# Patient Record
Sex: Male | Born: 1965 | Hispanic: Yes | Smoking: Never smoker
Health system: Southern US, Community
[De-identification: ages and names within clinical notes are randomized; demographics above are authoritative.]

## PROBLEM LIST (undated history)

## (undated) DIAGNOSIS — I10 Essential (primary) hypertension: Secondary | ICD-10-CM

## (undated) HISTORY — PX: CHOLECYSTECTOMY: SHX55

---

## 2016-01-02 ENCOUNTER — Encounter (HOSPITAL_BASED_OUTPATIENT_CLINIC_OR_DEPARTMENT_OTHER): Payer: Self-pay | Admitting: Emergency Medicine

## 2016-01-02 ENCOUNTER — Emergency Department (HOSPITAL_BASED_OUTPATIENT_CLINIC_OR_DEPARTMENT_OTHER)
Admission: EM | Admit: 2016-01-02 | Discharge: 2016-01-02 | Disposition: A | Discharge status: Home / Self Care | Payer: Worker's Compensation | Attending: Emergency Medicine | Admitting: Emergency Medicine

## 2016-01-02 DIAGNOSIS — Y9389 Activity, other specified: Secondary | ICD-10-CM | POA: Insufficient documentation

## 2016-01-02 DIAGNOSIS — Y990 Civilian activity done for income or pay: Secondary | ICD-10-CM | POA: Diagnosis not present

## 2016-01-02 DIAGNOSIS — S39012A Strain of muscle, fascia and tendon of lower back, initial encounter: Secondary | ICD-10-CM

## 2016-01-02 DIAGNOSIS — I10 Essential (primary) hypertension: Secondary | ICD-10-CM | POA: Insufficient documentation

## 2016-01-02 DIAGNOSIS — Y929 Unspecified place or not applicable: Secondary | ICD-10-CM | POA: Insufficient documentation

## 2016-01-02 DIAGNOSIS — X500XXA Overexertion from strenuous movement or load, initial encounter: Secondary | ICD-10-CM | POA: Diagnosis not present

## 2016-01-02 DIAGNOSIS — S3992XA Unspecified injury of lower back, initial encounter: Secondary | ICD-10-CM | POA: Diagnosis present

## 2016-01-02 HISTORY — DX: Essential (primary) hypertension: I10

## 2016-01-02 MED ORDER — KETOROLAC TROMETHAMINE 60 MG/2ML IM SOLN
60.0000 mg | Freq: Once | INTRAMUSCULAR | Status: AC
  Administered 2016-01-02: 60 mg via INTRAMUSCULAR
  Filled 2016-01-02: qty 2

## 2016-01-02 MED ORDER — HYDROCODONE-ACETAMINOPHEN 5-325 MG PO TABS: 1.0000 | ORAL_TABLET | Freq: Four times a day (QID) | ORAL | Status: DC | PRN

## 2016-01-02 MED ORDER — HYDROCODONE-ACETAMINOPHEN 5-325 MG PO TABS
2.0000 | ORAL_TABLET | Freq: Once | ORAL | Status: AC
  Administered 2016-01-02: 2 via ORAL
  Filled 2016-01-02: qty 2

## 2016-01-02 NOTE — Discharge Instructions (Signed)
Ibuprofen 600 mg 3 times daily for the next 5 days.  Hydrocodone as needed for pain not relieved with ibuprofen.  Follow-up with a primary Dr. if you are not improving in 1 week to discuss physical therapy or possibly imaging studies.   Distensin lumbosacra (Lumbosacral Strain) La distensin lumbosacra es una distensin de cualquiera de las partes que componen las vrtebras lumbosacras. Las vrtebras lumbosacras son los huesos que conforman el tercio inferior de la columna vertebral. Estas vrtebras estn sostenidas por msculos y un resistente tejido fibroso (ligamentos).  CAUSAS  Un golpe repentino en la espalda puede provocar una distensin lumbosacra. Adems, cualquier tipo de movimiento que cause una elongacin excesiva de los msculos de la zona lumbar puede provocar este tipo de distensin. Esto se ve normalmente en las personas que se esfuerzan demasiado, se caen, levantan objetos pesados, se agachan o estn en cuclillas con regularidad. FACTORES DE RIESGO  Trabajo agotador.  Participar en deportes en los cuales se deba empujar o tirar y que requieren de un giro repentino de la espalda (tenis, golf, bisbol).  Levantar peso.  Curvatura excesiva de la zona lumbar.  Pelvis hacia adelante.  Espalda o msculos abdominales dbiles, o ambos.  Tendones isquiotibiales tensos. SIGNOS Y SNTOMAS  La distensin lumbosacra puede provocar dolor en la zona de la lesin o un dolor que baja (se extiende) hasta la pierna.  DIAGNSTICO Con frecuencia, el mdico puede diagnosticar una distensin BellSouthlumbosacra mediante un examen fsico. En algunos casos, es posible que deba realizarse pruebas, como una Edmondsradiografa.  TRATAMIENTO  El tratamiento para la lesin lumbar depende de muchos factores que el mdico Paediatric nursedeber evaluar. Sin embargo, la Harley-Davidsonmayora de los tratamientos incluye el uso de antiinflamatorios. INSTRUCCIONES PARA EL CUIDADO EN EL HOGAR   Evite actividades fsicas difciles (tenis,  raquetbol, esqu acutico) si no tiene un buen estado fsico para practicarlas. Esto puede Radio produceragravar o provocar problemas.  Si tiene un problema en la espalda, evite los deportes que requieren de movimientos corporales bruscos. La natacin y las caminatas son las actividades ms seguras.  Mantenga una buena postura.  Mantenga un peso saludable.  En el caso de episodios agudos, puede colocar hielo en la zona lesionada.  Ponga el hielo en una bolsa plstica.  Coloque una toalla entre la piel y la bolsa de hielo.  Deje el hielo durante 20 minutos, 2 a 3 veces por da.  Cuando la zona lumbar comience a sanar, es posible que le recomienden ejercicios de elongacin y fortalecimiento. SOLICITE ATENCIN MDICA SI:  El dolor de Oncologistespalda empeora.  Tiene un dolor de espalda intenso que no mejora con medicamentos. SOLICITE ATENCIN MDICA DE INMEDIATO SI:   Siente entumecimiento, hormigueo, debilidad o problemas con el uso de los brazos o las piernas.  Nota cambios en el control de la vejiga o el intestino.  Siente un aumento del Cytogeneticistdolor en cualquier parte del cuerpo, incluido el vientre (abdomen).  Nota que le falta el aire, se siente mareado o se desmaya.  Tiene Programme researcher, broadcasting/film/videomalestar estomacal (nuseas), vomita o comienza a sudar.  Nota un cambio de color en los dedos del pie o las piernas, o los pies se ponen muy fros. ASEGRESE DE QUE:   Comprende estas instrucciones.  Controlar su afeccin.  Recibir ayuda de inmediato si no mejora o si empeora.   Esta informacin no tiene Theme park managercomo fin reemplazar el consejo del mdico. Asegrese de hacerle al mdico cualquier pregunta que tenga.   Document Released: 05/14/2005 Document Revised: 08/09/2013 Elsevier Interactive Patient  Education ©2016 Elsevier Inc. ° °

## 2016-01-02 NOTE — ED Provider Notes (Signed)
CSN: [id number]     Arrival date & time 01/02/16  1733 History  By signing my name below, I, [facility]oscopy CenterMarrissa Washington, attest that this documentation has been prepared under the direction and in the presence of Geoffery Lyonsouglas Lyfe Reihl, MD. Electronically Signed: Randell PatientMarrissa Washington, ED Scribe. 01/02/2016. 6:41 PM.   Chief Complaint  Patient presents with  . Back Pain   Patient is a 50 y.o. male presenting with back pain. The history is provided by the patient and a relative. No language interpreter was used.  Back Pain Location:  Lumbar spine Radiates to:  Does not radiate Pain severity:  Moderate Pain is:  Same all the time Onset quality:  Gradual Duration:  1 month Timing:  Constant Progression:  Worsening Chronicity:  New Context: occupational injury and recent injury   Relieved by:  Being still Worsened by:  Movement Associated symptoms: no bladder incontinence, no bowel incontinence and no leg pain    HPI Comments: Christian Wade is a 50 y.o. male who presents to the Emergency Department complaining of constant, moderate, back pain onset 1 month ago after an injury, and worse today after heavy lifting. History provided by the relative who acted as an interpreter due to a language barrier. Relative reports that the pt injured his back 1 month ago after which he was evaluated by a doctor who wrote him out of work for 2 weeks with a lifting restriction where he was not supposed to lift anything heavier than 10 lbs when he returned. He states that pt was at work today when he lifted a heavy truck axle followed by an increase in his back pain. Pain is worse with movement and with sitting still for long periods of time. Denies recent falls, bowel/bladder incontinence, radiation of pain into his legs.  No PCP curretly Past Medical History  Diagnosis Date  . Hypertension    Past Surgical History  Procedure Laterality Date  . Appendectomy     History reviewed. No pertinent family history. Social  History  Substance Use Topics  . Smoking status: Never Smoker   . Smokeless tobacco: None  . Alcohol Use: No    Review of Systems  Gastrointestinal: Negative for bowel incontinence.  Genitourinary: Negative for bladder incontinence.  Musculoskeletal: Positive for back pain.  All other systems reviewed and are negative.     Allergies  Review of patient's allergies indicates no known allergies.  Home Medications   Prior to Admission medications   Medication Sig Start Date End Date Taking? Authorizing Provider  HYDROcodone-acetaminophen (NORCO) 5-325 MG tablet Take 1-2 tablets by mouth every 6 (six) hours as needed. 01/02/16   Geoffery Lyonsouglas Keyaira Clapham, MD   BP 133/72 mmHg  Pulse 95  Temp(Src) 97.5 F (36.4 C) (Oral)  Resp 18  Ht 5\' 6"  (1.676 m)  Wt 200 lb (90.719 kg)  BMI 32.30 kg/m2  SpO2 99% Physical Exam  Constitutional: He is oriented to person, place, and time. He appears well-developed and well-nourished.  HENT:  Head: Normocephalic and atraumatic.  Eyes: EOM are normal.  Neck: Normal range of motion.  Cardiovascular: Normal rate, regular rhythm, normal heart sounds and intact distal pulses.   Pulmonary/Chest: Effort normal and breath sounds normal. No respiratory distress.  Abdominal: Soft. He exhibits no distension. There is no tenderness.  Musculoskeletal: Normal range of motion. He exhibits tenderness.  TTP in the soft tissues of the left L-spine region.  Neurological: He is alert and oriented to person, place, and time.  DTRs are 2+ and  symmetrically in the BLE. Strength is 5/5 in BLE. Able to ambulate without difficulty.  Skin: Skin is warm and dry.  Psychiatric: He has a normal mood and affect. Judgment normal.  Nursing note and vitals reviewed.   ED Course  Procedures   DIAGNOSTIC STUDIES: Oxygen Saturation is 99% on RA, normal by my interpretation.    COORDINATION OF CARE: 6:37 PM Will order Toradol and Norco. Advised at home symptomatic treatment with rest  and ibuprofen. Will prescribe Norco. Advised for pt to follow-up with PCP as needed. Discussed treatment plan with pt at bedside and pt agreed to plan.   Labs Review Labs Reviewed - No data to display  Imaging Review No results found. I have personally reviewed and evaluated these images and lab results as part of my medical decision-making.   EKG Interpretation None      MDM   Final diagnoses:  Low back strain, initial encounter    Patient presents with complaints of low back pain that started while lifting a heavy object at work. There are no red flags in his history or exam that would suggest an emergent situation. He is having no bowel or bladder complaints or weakness and his strength and reflexes are symmetrical. He will be discharged with anti-inflammatories, pain medication, a work excuse, and when necessary follow-up with primary Dr. if not improving.  I personally performed the services described in this documentation, which was scribed in my presence. The recorded information has been reviewed and is accurate.       Geoffery Lyons, MD 01/02/16 2325

## 2016-01-02 NOTE — ED Notes (Signed)
Pt in c/o increased back pain after back injury last month. States was seen last month and given work note stating no heavy lifting, but supervisor ignored this and has him continuing to do heavy lifting. Ambulatory in NAD. Appears uncomfortable.

## 2016-07-02 NOTE — Progress Notes (Signed)
Scheduling pre op appt- please PLACE SURGICAL ORDERS IN EPIC  thanks

## 2016-07-08 ENCOUNTER — Ambulatory Visit: Payer: Self-pay | Admitting: Orthopedic Surgery

## 2016-07-15 ENCOUNTER — Ambulatory Visit: Payer: Self-pay | Admitting: Orthopedic Surgery

## 2016-07-15 NOTE — H&P (Signed)
Christian Wade is an 50 y.o. male.   Chief Complaint: back and L leg pain HPI: The patient is a 50 year old male who presents today for follow up of their back. The patient is being followed for their back pain. They are now 12 weeks and 5 days out from injury (DOI 01/02/16). Symptoms reported today include: pain. Current treatment includes: physical therapy, activity modification, pain medications and Neurontin and Voltaren Gel. The following medication has been used for pain control: Percocet. The patient reports their current pain level to be mild (to severe). The patient has not gotten any relief of their symptoms with physical therapy. Note for "Follow-up back": The patient was placed on a 10lb. lifting restriction, no bending, stooping, or squatting, and no prolonged sitting or standing. NCM Alberto Larrazabal.  Christian Wade follows up. He is having persistent lower extremity radicular pain, worse with activity, better with rest. It will radiate down into the leg. He has taken Percocet and gabapentin. He is on a light duty position. His case manager is Alberto Larrazabal.  He reports continued pain and weakness into the left leg. He is unable to fully flex.  He is having numbness down the anterior aspect of the leg as well. He has mid thoracic pain as well.  Past Medical History:  Diagnosis Date  . Hypertension     Past Surgical History:  Procedure Laterality Date  . APPENDECTOMY      No family history on file. Social History:  reports that he has never smoked. He does not have any smokeless tobacco history on file. He reports that he does not drink alcohol. His drug history is not on file.  Allergies: No Known Allergies   (Not in a hospital admission)  No results found for this or any previous visit (from the past 48 hour(s)). No results found.  Review of Systems  Constitutional: Negative.   HENT: Negative.   Eyes: Negative.   Respiratory: Negative.   Cardiovascular:  Negative.   Gastrointestinal: Negative.   Genitourinary: Negative.   Musculoskeletal: Positive for back pain.  Skin: Negative.   Neurological: Positive for sensory change and focal weakness.  Psychiatric/Behavioral: Negative.     There were no vitals taken for this visit. Physical Exam  Constitutional: He is oriented to person, place, and time. He appears well-developed. He appears distressed.  HENT:  Head: Normocephalic.  Eyes: Pupils are equal, round, and reactive to light.  Neck: Normal range of motion.  Cardiovascular: Normal rate.   Respiratory: Effort normal.  GI: Soft.  Musculoskeletal:  On exam, he is in moderate distress. Mood and affect is appropriate, leaning to the right. His femoral stretch is positive. He has 4/5 hip flexor strength as well as quad strength on the left. Decreased sensation in the L3 dermatome. Lumbar spine exam reveals no evidence of soft tissue swelling, deformity or skin ecchymosis. On palpation there is no tenderness of the lumbar spine. No flank pain with percussion. The abdomen is soft and nontender. Nontender over the trochanters. No cellulitis or lymphadenopathy.  Good range of motion of the lumbar spine without associated pain. Straight leg raise is negative. Motor is 5/5 including EHL, tibialis anterior, plantar flexion, quadriceps and hamstrings. Patient is normoreflexic. There is no Babinski or clonus. Sensory exam is intact to light touch. Patient has good distal pulses. No DVT. No pain and normal range of motion without instability of the hips, knees and ankles.  Neurological: He is alert and oriented to person,   place, and time.  Skin: Skin is warm.    Again, MRI demonstrates an extruded fragment from the L2-3 disc inferior to the L3 pedicle compressing the L3 nerve root. He has decreased knee reflex.  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  Extensive discussion concerning current pathology, relevant anatomy, and treatment options for 45 minutes dedicated in this, discussion with his translator and utilizing PowerPoint presentation and illustrated handout. We discussed living with his symptoms because he is at maximum medical improvement versus proceeding with lumbar decompression. He does have an extruded fragment and a neurologic deficit. We discussed micro lumbar decompression at L2-3 on the left. I had an extensive discussion of the risks and benefits of the lumbar decompression with the patient including bleeding, infection, damage to neurovascular structures, epidural fibrosis, CSF leak requiring repair. We also discussed increase in pain, adjacent segment disease, recurrent disc herniation, need for future surgery including repeat decompression and/or fusion. We also discussed risks of postoperative hematoma, paralysis, anesthetic complications including DVT, PE, death, cardiopulmonary dysfunction. In addition, the perioperative and postoperative courses were discussed in detail including the rehabilitative time and return to functional activity and work. I provided the patient with an illustrated handout and utilized the appropriate surgical models.  There is no history of tobacco use, negative myocardial infarction in the family or diabetes. He has hypertension. His brother actually had a stroke with hypertension at an early age. He takes a blood pressure pill. He does not currently have a dedicated family medicine physician. We will check his blood pressure and have him go for treatment for that right now to the clinic and proceed accordingly. Once he is normotensive, we can proceed with lumbar decompression. We may have to get Workmen's Comp to authorize a visit for him, but we will try that ASAP. I had an extensive discussion of the risks  and benefits of the lumbar decompression with the patient including bleeding, infection, damage to neurovascular structures, epidural fibrosis, CSF leak requiring repair. We also discussed increase in pain, adjacent segment disease, recurrent disc herniation, need for future surgery including repeat decompression and/or fusion. We also discussed risks of postoperative hematoma, paralysis, anesthetic complications including DVT, PE, death, cardiopulmonary dysfunction. In addition, the perioperative and postoperative courses were discussed in detail including the rehabilitative time and return to functional activity and work. I provided the patient with an illustrated handout and utilized the appropriate surgical models.  Overnight in the hospital, two weeks for suture removal, six weeks until physical therapy until light duty is available, three months of maximum medical improvement, six weeks of work conditioning. Continue on his Percocet, Voltaren, and Neurontin. No lifting over 10 pounds, repetitive bending, prolonged standing or sitting.  Plan microlumbar decompression L2-3 left  Nahome Bublitz, Dayna BarkerJACLYN M., PA-C for Dr. Shelle IronBeane 07/15/2016, 11:13 AM

## 2016-07-21 NOTE — Patient Instructions (Signed)
Christian Wade  07/21/2016   Your procedure is scheduled on: 07/23/2016    [facility]  Entrance take [place]  elevators to 3rd floor to  Short Stay Center at   0630 AM.  Call this number if you have problems the morning of surgery [phone]   Remember: ONLY 1 PERSON MAY GO WITH YOU TO SHORT STAY TO GET  READY MORNING OF YOUR SURGERY.  Do not eat food or drink liquids :After Midnight.     Take these medicines the morning of surgery with A SIP OF WATER: Hydrocodone if needed                                 You may not have any metal on your body including hair pins and              piercings  Do not wear jewelry,  lotions, powders or perfumes, deodorant                         Men may shave face and neck.   Do not bring valuables to the hospital. [place] IS NOT             RESPONSIBLE   FOR VALUABLES.  Contacts, dentures or bridgework may not be worn into surgery.  Leave suitcase in the car. After surgery it may be brought to your room.       Special Instructions: N/A              Please read over the following fact sheets you were given: _____________________________________________________________________             [facility] - Pre[facility] Before surgery, you can play an important role.  Because skin is not sterile, your skin needs to be as free of germs as possible.  You can reduce the number of germs on your skin by washing with CHG (chlorahexidine gluconate) soap before surgery.  CHG is an antiseptic cleaner which kills germs and bonds with the skin to continue killing germs even after washing. Please DO NOT use if you have an allergy to CHG or antibacterial soaps.  If your skin becomes reddened/irritated stop using the CHG and inform your nurse when you arrive at Short Stay. Do not shave (including legs and underarms) for at least 48 hours prior to the first CHG shower.  You may shave your face/neck. Please follow these  instructions carefully:  1.  Shower with CHG Soap the night before surgery and the  morning of Surgery.  2.  If you choose to wash your hair, wash your hair first as usual with your  normal  shampoo.  3.  After you shampoo, rinse your hair and body thoroughly to remove the  shampoo.                           4.  Use CHG as you would any other liquid soap.  You can apply chg directly  to the skin and wash                       Gently with a scrungie or clean washcloth.  5.  Apply the CHG Soap to your body ONLY FROM  THE NECK DOWN.   Do not use on face/ open                           Wound or open sores. Avoid contact with eyes, ears mouth and genitals (private parts).                       Wash face,  Genitals (private parts) with your normal soap.             6.  Wash thoroughly, paying special attention to the area where your surgery  will be performed.  7.  Thoroughly rinse your body with warm water from the neck down.  8.  DO NOT shower/wash with your normal soap after using and rinsing off  the CHG Soap.                9.  Pat yourself dry with a clean towel.            10.  Wear clean pajamas.            11.  Place clean sheets on your bed the night of your first shower and do not  sleep with pets. Day of Surgery : Do not apply any lotions/deodorants the morning of surgery.  Please wear clean clothes to the hospital/surgery center.  FAILURE TO FOLLOW THESE INSTRUCTIONS MAY RESULT IN THE CANCELLATION OF YOUR SURGERY PATIENT SIGNATURE_________________________________  NURSE SIGNATURE__________________________________  ________________________________________________________________________  WHAT IS A BLOOD TRANSFUSION? Blood Transfusion Information  A transfusion is the replacement of blood or some of its parts. Blood is made up of multiple cells which provide different functions.  Red blood cells carry oxygen and are used for blood loss replacement.  White blood cells fight against  infection.  Platelets control bleeding.  Plasma helps clot blood.  Other blood products are available for specialized needs, such as hemophilia or other clotting disorders. BEFORE THE TRANSFUSION  Who gives blood for transfusions?   Healthy volunteers who are fully evaluated to make sure their blood is safe. This is blood bank blood. Transfusion therapy is the safest it has ever been in the practice of medicine. Before blood is taken from a donor, a complete history is taken to make sure that person has no history of diseases nor engages in risky social behavior (examples are intravenous drug use or sexual activity with multiple partners). The donor's travel history is screened to minimize risk of transmitting infections, such as malaria. The donated blood is tested for signs of infectious diseases, such as HIV and hepatitis. The blood is then tested to be sure it is compatible with you in order to minimize the chance of a transfusion reaction. If you or a relative donates blood, this is often done in anticipation of surgery and is not appropriate for emergency situations. It takes many days to process the donated blood. RISKS AND COMPLICATIONS Although transfusion therapy is very safe and saves many lives, the main dangers of transfusion include:   Getting an infectious disease.  Developing a transfusion reaction. This is an allergic reaction to something in the blood you were given. Every precaution is taken to prevent this. The decision to have a blood transfusion has been considered carefully by your caregiver before blood is given. Blood is not given unless the benefits outweigh the risks. AFTER THE TRANSFUSION  Right after receiving a blood transfusion, you will usually feel much better and  more energetic. This is especially true if your red blood cells have gotten low (anemic). The transfusion raises the level of the red blood cells which carry oxygen, and this usually causes an energy  increase.  The nurse administering the transfusion will monitor you carefully for complications. HOME CARE INSTRUCTIONS  No special instructions are needed after a transfusion. You may find your energy is better. Speak with your caregiver about any limitations on activity for underlying diseases you may have. SEEK MEDICAL CARE IF:   Your condition is not improving after your transfusion.  You develop redness or irritation at the intravenous (IV) site. SEEK IMMEDIATE MEDICAL CARE IF:  Any of the following symptoms occur over the next 12 hours:  Shaking chills.  You have a temperature by mouth above 102 F (38.9 C), not controlled by medicine.  Chest, back, or muscle pain.  People around you feel you are not acting correctly or are confused.  Shortness of breath or difficulty breathing.  Dizziness and fainting.  You get a rash or develop hives.  You have a decrease in urine output.  Your urine turns a dark color or changes to pink, red, or brown. Any of the following symptoms occur over the next 10 days:  You have a temperature by mouth above 102 F (38.9 C), not controlled by medicine.  Shortness of breath.  Weakness after normal activity.  The white part of the eye turns yellow (jaundice).  You have a decrease in the amount of urine or are urinating less often.  Your urine turns a dark color or changes to pink, red, or brown. Document Released: 08/01/2000 Document Revised: 10/27/2011 Document Reviewed: 03/20/2008 ExitCare Patient Information 2014 [place].  _______________________________________________________________________  Incentive Spirometer  An incentive spirometer is a tool that can help keep your lungs clear and active. This tool measures how well you are filling your lungs with each breath. Taking long deep breaths may help reverse or decrease the chance of developing breathing (pulmonary) problems (especially infection) following:  A long  period of time when you are unable to move or be active. BEFORE THE PROCEDURE   If the spirometer includes an indicator to show your best effort, your nurse or respiratory therapist will set it to a desired goal.  If possible, sit up straight or lean slightly forward. Try not to slouch.  Hold the incentive spirometer in an upright position. INSTRUCTIONS FOR USE  1. Sit on the edge of your bed if possible, or sit up as far as you can in bed or on a chair. 2. Hold the incentive spirometer in an upright position. 3. Breathe out normally. 4. Place the mouthpiece in your mouth and seal your lips tightly around it. 5. Breathe in slowly and as deeply as possible, raising the piston or the ball toward the top of the column. 6. Hold your breath for 3-5 seconds or for as long as possible. Allow the piston or ball to fall to the bottom of the column. 7. Remove the mouthpiece from your mouth and breathe out normally. 8. Rest for a few seconds and repeat Steps 1 through 7 at least 10 times every 1-2 hours when you are awake. Take your time and take a few normal breaths between deep breaths. 9. The spirometer may include an indicator to show your best effort. Use the indicator as a goal to work toward during each repetition. 10. After each set of 10 deep breaths, practice coughing to be sure your lungs  are clear. If you have an incision (the cut made at the time of surgery), support your incision when coughing by placing a pillow or rolled up towels firmly against it. Once you are able to get out of bed, walk around indoors and cough well. You may stop using the incentive spirometer when instructed by your caregiver.  RISKS AND COMPLICATIONS  Take your time so you do not get dizzy or light-headed.  If you are in pain, you may need to take or ask for pain medication before doing incentive spirometry. It is harder to take a deep breath if you are having pain. AFTER USE  Rest and breathe slowly and  easily.  It can be helpful to keep track of a log of your progress. Your caregiver can provide you with a simple table to help with this. If you are using the spirometer at home, follow these instructions: [place] IF:   You are having difficultly using the spirometer.  You have trouble using the spirometer as often as instructed.  Your pain medication is not giving enough relief while using the spirometer.  You develop fever of 100.5 F (38.1 C) or higher. SEEK IMMEDIATE MEDICAL CARE IF:   You cough up bloody sputum that had not been present before.  You develop fever of 102 F (38.9 C) or greater.  You develop worsening pain at or near the incision site. MAKE SURE YOU:   Understand these instructions.  Will watch your condition.  Will get help right away if you are not doing well or get worse. Document Released: 12/15/2006 Document Revised: 10/27/2011 Document Reviewed: 02/15/2007 [facility] Patient Information 2014 [place], [place].   ________________________________________________________________________

## 2016-07-22 ENCOUNTER — Encounter (HOSPITAL_COMMUNITY)
Admission: RE | Admit: 2016-07-22 | Discharge: 2016-07-22 | Disposition: A | Discharge status: Home / Self Care | Payer: Worker's Compensation | Source: Ambulatory Visit | Attending: Specialist | Admitting: Specialist

## 2016-07-22 ENCOUNTER — Ambulatory Visit (HOSPITAL_COMMUNITY)
Admission: RE | Admit: 2016-07-22 | Discharge: 2016-07-22 | Disposition: A | Discharge status: Home / Self Care | Payer: Self-pay | Source: Ambulatory Visit | Attending: Orthopedic Surgery | Admitting: Orthopedic Surgery

## 2016-07-22 ENCOUNTER — Encounter (HOSPITAL_COMMUNITY): Payer: Self-pay

## 2016-07-22 DIAGNOSIS — M5126 Other intervertebral disc displacement, lumbar region: Secondary | ICD-10-CM | POA: Insufficient documentation

## 2016-07-22 DIAGNOSIS — Z79899 Other long term (current) drug therapy: Secondary | ICD-10-CM | POA: Diagnosis not present

## 2016-07-22 DIAGNOSIS — M48061 Spinal stenosis, lumbar region without neurogenic claudication: Secondary | ICD-10-CM | POA: Diagnosis not present

## 2016-07-22 DIAGNOSIS — M5136 Other intervertebral disc degeneration, lumbar region: Secondary | ICD-10-CM | POA: Insufficient documentation

## 2016-07-22 DIAGNOSIS — I10 Essential (primary) hypertension: Secondary | ICD-10-CM | POA: Diagnosis not present

## 2016-07-22 LAB — CBC
HCT: 47.1 % (ref 39.0–52.0)
Hemoglobin: 16.5 g/dL (ref 13.0–17.0)
MCH: 31 pg (ref 26.0–34.0)
MCHC: 35 g/dL (ref 30.0–36.0)
MCV: 88.4 fL (ref 78.0–100.0)
PLATELETS: 278 10*3/uL (ref 150–400)
RBC: 5.33 MIL/uL (ref 4.22–5.81)
RDW: 13.1 % (ref 11.5–15.5)
WBC: 11.5 10*3/uL — AB (ref 4.0–10.5)

## 2016-07-22 LAB — BASIC METABOLIC PANEL
Anion gap: 9 (ref 5–15)
BUN: 21 mg/dL — AB (ref 6–20)
CALCIUM: 8.8 mg/dL — AB (ref 8.9–10.3)
CO2: 25 mmol/L (ref 22–32)
Chloride: 104 mmol/L (ref 101–111)
Creatinine, Ser: 0.91 mg/dL (ref 0.61–1.24)
GFR calc Af Amer: >60 mL/min (ref >60)
Glucose, Bld: 95 mg/dL (ref 65–99)
Potassium: 3.9 mmol/L (ref 3.5–5.1)
SODIUM: 138 mmol/L (ref 135–145)

## 2016-07-22 LAB — SURGICAL PCR SCREEN
MRSA, PCR: NEGATIVE
Staphylococcus aureus: NEGATIVE

## 2016-07-22 LAB — ABO/RH: ABO/RH(D): O POS

## 2016-07-22 NOTE — Progress Notes (Signed)
CBC done 07/22/16 faxed via EPIC to DR BEane.

## 2016-07-22 NOTE — Anesthesia Preprocedure Evaluation (Addendum)
Anesthesia Evaluation  Patient identified by MRN, date of birth, ID band Patient awake    Reviewed: Allergy & Precautions, NPO status , Patient's Chart, lab work & pertinent test results  History of Anesthesia Complications Negative for: history of anesthetic complications  Airway Mallampati: II  TM Distance: >3 FB Neck ROM: Full    Dental no notable dental hx. (+) Dental Advisory Given   Pulmonary neg pulmonary ROS,    Pulmonary exam normal        Cardiovascular hypertension, Pt. on medications negative cardio ROS Normal cardiovascular exam     Neuro/Psych negative psych ROS   GI/Hepatic negative GI ROS, Neg liver ROS,   Endo/Other  negative endocrine ROS  Renal/GU negative Renal ROS     Musculoskeletal negative musculoskeletal ROS (+)   Abdominal   Peds  Hematology negative hematology ROS (+)   Anesthesia Other Findings Day of surgery medications reviewed with the patient.  Reproductive/Obstetrics                           Anesthesia Physical Anesthesia Plan  ASA: II  Anesthesia Plan: General   Post-op Pain Management:    Induction: Intravenous  Airway Management Planned: Oral ETT  Additional Equipment:   Intra-op Plan:   Post-operative Plan: Extubation in OR  Informed Consent: I have reviewed the patients History and Physical, chart, labs and discussed the procedure including the risks, benefits and alternatives for the proposed anesthesia with the patient or authorized representative who has indicated his/her understanding and acceptance.   Dental advisory given  Plan Discussed with: CRNA and Anesthesiologist  Anesthesia Plan Comments: (Interpreter was used for the interview)      Anesthesia Quick Evaluation

## 2016-07-22 NOTE — Progress Notes (Addendum)
Initially at preop visit blood pressure reading was 159/113 in right arm .  Patient states he took blood pressure medications today.  Patient denies any dizziness, lightheadedness or headache.  Patient does state he is in pain and nervous.  At end of preop appointment blood pressure was 165/98 in right arm at 1430pm.  Waited approximately 10 more minutes and at 1440pm blood pressure was 158/93 in right arm.   Patient states at preop appointment with interpreter present that was on blood pressure medications until last week that were making him feel really tired and he saw Dr Reymundo Pollarolyn Pedley and started the lisinopril and amlopine on 07/21/2016 per patient.

## 2016-07-22 NOTE — Progress Notes (Signed)
Clearance on chart dated 04/24/16 from NP.

## 2016-07-23 ENCOUNTER — Encounter (HOSPITAL_COMMUNITY): Payer: Self-pay | Admitting: *Deleted

## 2016-07-23 ENCOUNTER — Ambulatory Visit (HOSPITAL_COMMUNITY): Payer: Worker's Compensation | Admitting: Anesthesiology

## 2016-07-23 ENCOUNTER — Ambulatory Visit (HOSPITAL_COMMUNITY): Payer: Worker's Compensation

## 2016-07-23 ENCOUNTER — Ambulatory Visit (HOSPITAL_COMMUNITY)
Admission: RE | Admit: 2016-07-23 | Discharge: 2016-07-24 | Disposition: A | Discharge status: Home / Self Care | Payer: Worker's Compensation | Source: Ambulatory Visit | Attending: Specialist | Admitting: Specialist

## 2016-07-23 ENCOUNTER — Encounter (HOSPITAL_COMMUNITY)
Admission: RE | Disposition: A | Discharge status: Home / Self Care | Payer: Self-pay | Source: Ambulatory Visit | Attending: Specialist

## 2016-07-23 DIAGNOSIS — M48061 Spinal stenosis, lumbar region without neurogenic claudication: Secondary | ICD-10-CM | POA: Diagnosis present

## 2016-07-23 DIAGNOSIS — Z79899 Other long term (current) drug therapy: Secondary | ICD-10-CM | POA: Insufficient documentation

## 2016-07-23 DIAGNOSIS — I10 Essential (primary) hypertension: Secondary | ICD-10-CM | POA: Insufficient documentation

## 2016-07-23 DIAGNOSIS — M5126 Other intervertebral disc displacement, lumbar region: Secondary | ICD-10-CM | POA: Diagnosis not present

## 2016-07-23 DIAGNOSIS — R0902 Hypoxemia: Secondary | ICD-10-CM

## 2016-07-23 DIAGNOSIS — Z419 Encounter for procedure for purposes other than remedying health state, unspecified: Secondary | ICD-10-CM

## 2016-07-23 HISTORY — PX: LUMBAR LAMINECTOMY/DECOMPRESSION MICRODISCECTOMY: SHX5026

## 2016-07-23 LAB — TYPE AND SCREEN
ABO/RH(D): O POS
ANTIBODY SCREEN: NEGATIVE

## 2016-07-23 SURGERY — LUMBAR LAMINECTOMY/DECOMPRESSION MICRODISCECTOMY 1 LEVEL
Anesthesia: General | Site: Back | Laterality: Bilateral

## 2016-07-23 MED ORDER — ACETAMINOPHEN 325 MG PO TABS: 650.0000 mg | ORAL_TABLET | ORAL | Status: DC | PRN

## 2016-07-23 MED ORDER — ACETAMINOPHEN 650 MG RE SUPP: 650.0000 mg | RECTAL | Status: DC | PRN

## 2016-07-23 MED ORDER — PROPOFOL 10 MG/ML IV BOLUS
INTRAVENOUS | Status: DC | PRN
  Administered 2016-07-23: 200 mg via INTRAVENOUS

## 2016-07-23 MED ORDER — CEFAZOLIN SODIUM-DEXTROSE 2-4 GM/100ML-% IV SOLN
2.0000 g | INTRAVENOUS | Status: AC
  Administered 2016-07-23: 2 g via INTRAVENOUS

## 2016-07-23 MED ORDER — BISACODYL 5 MG PO TBEC
5.0000 mg | DELAYED_RELEASE_TABLET | Freq: Every day | ORAL | Status: DC | PRN
  Filled 2016-07-23: qty 1

## 2016-07-23 MED ORDER — LIDOCAINE-EPINEPHRINE (PF) 2 %-1:200000 IJ SOLN
INTRAMUSCULAR | Status: DC | PRN
  Administered 2016-07-23: 7 mL via INTRADERMAL

## 2016-07-23 MED ORDER — MIDAZOLAM HCL 2 MG/2ML IJ SOLN
INTRAMUSCULAR | Status: AC
  Filled 2016-07-23: qty 2

## 2016-07-23 MED ORDER — LIDOCAINE-EPINEPHRINE (PF) 2 %-1:200000 IJ SOLN
INTRAMUSCULAR | Status: AC
  Filled 2016-07-23: qty 20

## 2016-07-23 MED ORDER — DOCUSATE SODIUM 100 MG PO CAPS
100.0000 mg | ORAL_CAPSULE | Freq: Two times a day (BID) | ORAL | Status: DC
  Administered 2016-07-23 – 2016-07-24 (×2): 100 mg via ORAL
  Filled 2016-07-23 (×2): qty 1

## 2016-07-23 MED ORDER — BUPIVACAINE HCL (PF) 0.5 % IJ SOLN
INTRAMUSCULAR | Status: AC
  Filled 2016-07-23: qty 30

## 2016-07-23 MED ORDER — OXYCODONE-ACETAMINOPHEN 5-325 MG PO TABS: 1.0000 | ORAL_TABLET | ORAL | 0 refills | Status: AC | PRN

## 2016-07-23 MED ORDER — PROMETHAZINE HCL 25 MG/ML IJ SOLN: 6.2500 mg | INTRAMUSCULAR | Status: DC | PRN

## 2016-07-23 MED ORDER — FENTANYL CITRATE (PF) 100 MCG/2ML IJ SOLN
INTRAMUSCULAR | Status: AC
  Filled 2016-07-23: qty 2

## 2016-07-23 MED ORDER — SUGAMMADEX SODIUM 200 MG/2ML IV SOLN
INTRAVENOUS | Status: DC | PRN
  Administered 2016-07-23: 200 mg via INTRAVENOUS

## 2016-07-23 MED ORDER — PHENOL 1.4 % MT LIQD
1.0000 | OROMUCOSAL | Status: DC | PRN
  Filled 2016-07-23: qty 177

## 2016-07-23 MED ORDER — CEFAZOLIN SODIUM-DEXTROSE 2-4 GM/100ML-% IV SOLN
2.0000 g | Freq: Three times a day (TID) | INTRAVENOUS | Status: AC
  Administered 2016-07-23 – 2016-07-24 (×3): 2 g via INTRAVENOUS
  Filled 2016-07-23 (×3): qty 100

## 2016-07-23 MED ORDER — ONDANSETRON HCL 4 MG/2ML IJ SOLN
INTRAMUSCULAR | Status: AC
  Filled 2016-07-23: qty 2

## 2016-07-23 MED ORDER — LACTATED RINGERS IV SOLN
INTRAVENOUS | Status: DC
  Administered 2016-07-23 (×2): via INTRAVENOUS

## 2016-07-23 MED ORDER — ALUM & MAG HYDROXIDE-SIMETH 200-200-20 MG/5ML PO SUSP: 30.0000 mL | Freq: Four times a day (QID) | ORAL | Status: DC | PRN

## 2016-07-23 MED ORDER — MAGNESIUM CITRATE PO SOLN: 1.0000 | Freq: Once | ORAL | Status: DC | PRN

## 2016-07-23 MED ORDER — OXYCODONE-ACETAMINOPHEN 5-325 MG PO TABS
1.0000 | ORAL_TABLET | ORAL | Status: DC | PRN
  Administered 2016-07-24: 10:00:00 2 via ORAL
  Filled 2016-07-23 (×2): qty 2

## 2016-07-23 MED ORDER — HYDROMORPHONE HCL 1 MG/ML IJ SOLN
0.5000 mg | INTRAMUSCULAR | Status: DC | PRN
  Administered 2016-07-23: 1 mg via INTRAVENOUS
  Filled 2016-07-23: qty 1

## 2016-07-23 MED ORDER — KCL IN DEXTROSE-NACL 20-5-0.45 MEQ/L-%-% IV SOLN
INTRAVENOUS | Status: AC
  Administered 2016-07-23: 16:00:00 via INTRAVENOUS
  Filled 2016-07-23 (×2): qty 1000

## 2016-07-23 MED ORDER — DOCUSATE SODIUM 100 MG PO CAPS: 100.0000 mg | ORAL_CAPSULE | Freq: Two times a day (BID) | ORAL | 1 refills | Status: AC | PRN

## 2016-07-23 MED ORDER — RISAQUAD PO CAPS
1.0000 | ORAL_CAPSULE | Freq: Every day | ORAL | Status: DC
  Administered 2016-07-23 – 2016-07-24 (×2): 1 via ORAL
  Filled 2016-07-23 (×2): qty 1

## 2016-07-23 MED ORDER — SCOPOLAMINE 1 MG/3DAYS TD PT72
1.0000 | MEDICATED_PATCH | TRANSDERMAL | Status: DC
  Administered 2016-07-23: 1.5 mg via TRANSDERMAL
  Filled 2016-07-23: qty 1

## 2016-07-23 MED ORDER — LIDOCAINE 2% (20 MG/ML) 5 ML SYRINGE
INTRAMUSCULAR | Status: DC | PRN
  Administered 2016-07-23: 50 mg via INTRAVENOUS

## 2016-07-23 MED ORDER — METHOCARBAMOL 1000 MG/10ML IJ SOLN
500.0000 mg | Freq: Four times a day (QID) | INTRAMUSCULAR | Status: DC | PRN
  Administered 2016-07-23: 500 mg via INTRAVENOUS
  Filled 2016-07-23: qty 5
  Filled 2016-07-23: qty 550

## 2016-07-23 MED ORDER — METHOCARBAMOL 500 MG PO TABS
500.0000 mg | ORAL_TABLET | Freq: Four times a day (QID) | ORAL | Status: DC | PRN
  Administered 2016-07-24 (×2): 500 mg via ORAL
  Filled 2016-07-23 (×2): qty 1

## 2016-07-23 MED ORDER — HYDROMORPHONE HCL 1 MG/ML IJ SOLN
0.2500 mg | INTRAMUSCULAR | Status: DC | PRN
  Administered 2016-07-23: 0.25 mg via INTRAVENOUS

## 2016-07-23 MED ORDER — THROMBIN 5000 UNITS EX SOLR
CUTANEOUS | Status: AC
  Filled 2016-07-23: qty 10000

## 2016-07-23 MED ORDER — PHENYLEPHRINE 40 MCG/ML (10ML) SYRINGE FOR IV PUSH (FOR BLOOD PRESSURE SUPPORT)
PREFILLED_SYRINGE | INTRAVENOUS | Status: DC | PRN
Start: 2016-07-23 — End: 2016-07-23
  Administered 2016-07-23 (×2): 80 ug via INTRAVENOUS

## 2016-07-23 MED ORDER — FENTANYL CITRATE (PF) 100 MCG/2ML IJ SOLN
INTRAMUSCULAR | Status: DC | PRN
  Administered 2016-07-23: 50 ug via INTRAVENOUS
  Administered 2016-07-23: 100 ug via INTRAVENOUS
  Administered 2016-07-23: 50 ug via INTRAVENOUS

## 2016-07-23 MED ORDER — GABAPENTIN 300 MG PO CAPS
300.0000 mg | ORAL_CAPSULE | Freq: Three times a day (TID) | ORAL | Status: DC | PRN
  Administered 2016-07-24: 300 mg via ORAL
  Filled 2016-07-23: qty 1

## 2016-07-23 MED ORDER — LIDOCAINE HCL 1 % IJ SOLN
INTRAMUSCULAR | Status: AC
  Filled 2016-07-23: qty 20

## 2016-07-23 MED ORDER — POLYMYXIN B SULFATE 500000 UNITS IJ SOLR
INTRAMUSCULAR | Status: AC
  Filled 2016-07-23: qty 500000

## 2016-07-23 MED ORDER — ROCURONIUM BROMIDE 10 MG/ML (PF) SYRINGE
PREFILLED_SYRINGE | INTRAVENOUS | Status: DC | PRN
  Administered 2016-07-23: 40 mg via INTRAVENOUS
  Administered 2016-07-23: 10 mg via INTRAVENOUS

## 2016-07-23 MED ORDER — MIDAZOLAM HCL 5 MG/5ML IJ SOLN
INTRAMUSCULAR | Status: DC | PRN
  Administered 2016-07-23: 2 mg via INTRAVENOUS

## 2016-07-23 MED ORDER — PROPOFOL 10 MG/ML IV BOLUS
INTRAVENOUS | Status: AC
  Filled 2016-07-23: qty 40

## 2016-07-23 MED ORDER — METHOCARBAMOL 500 MG PO TABS: 500.0000 mg | ORAL_TABLET | Freq: Four times a day (QID) | ORAL | 1 refills | Status: AC | PRN

## 2016-07-23 MED ORDER — POLYETHYLENE GLYCOL 3350 17 G PO PACK: 17.0000 g | PACK | Freq: Every day | ORAL | 0 refills | Status: AC

## 2016-07-23 MED ORDER — ONDANSETRON HCL 4 MG/2ML IJ SOLN
INTRAMUSCULAR | Status: DC | PRN
  Administered 2016-07-23: 4 mg via INTRAVENOUS

## 2016-07-23 MED ORDER — HYDROCODONE-ACETAMINOPHEN 5-325 MG PO TABS
1.0000 | ORAL_TABLET | ORAL | Status: DC | PRN
  Administered 2016-07-23 – 2016-07-24 (×4): 2 via ORAL
  Filled 2016-07-23 (×4): qty 2

## 2016-07-23 MED ORDER — MENTHOL 3 MG MT LOZG
1.0000 | LOZENGE | OROMUCOSAL | Status: DC | PRN
  Filled 2016-07-23: qty 9

## 2016-07-23 MED ORDER — POLYETHYLENE GLYCOL 3350 17 G PO PACK: 17.0000 g | PACK | Freq: Every day | ORAL | Status: DC | PRN

## 2016-07-23 MED ORDER — HYDROMORPHONE HCL 1 MG/ML IJ SOLN
INTRAMUSCULAR | Status: AC
  Administered 2016-07-23: 0.25 mg via INTRAVENOUS
  Filled 2016-07-23: qty 0.5

## 2016-07-23 MED ORDER — SODIUM CHLORIDE 0.9 % IR SOLN
Status: DC | PRN
  Administered 2016-07-23: 500 mL

## 2016-07-23 MED ORDER — CEFAZOLIN SODIUM-DEXTROSE 2-4 GM/100ML-% IV SOLN
INTRAVENOUS | Status: AC
  Filled 2016-07-23: qty 100

## 2016-07-23 MED ORDER — AMLODIPINE BESYLATE 10 MG PO TABS
10.0000 mg | ORAL_TABLET | Freq: Every day | ORAL | Status: DC
  Administered 2016-07-24: 08:00:00 10 mg via ORAL
  Filled 2016-07-23: qty 1

## 2016-07-23 MED ORDER — SUCCINYLCHOLINE CHLORIDE 20 MG/ML IJ SOLN
INTRAMUSCULAR | Status: DC | PRN
  Administered 2016-07-23: 120 mg via INTRAVENOUS

## 2016-07-23 MED ORDER — ONDANSETRON HCL 4 MG/2ML IJ SOLN: 4.0000 mg | INTRAMUSCULAR | Status: DC | PRN

## 2016-07-23 MED ORDER — THROMBIN 5000 UNITS EX SOLR
OROMUCOSAL | Status: DC | PRN
  Administered 2016-07-23: 10 mL via TOPICAL

## 2016-07-23 SURGICAL SUPPLY — 50 items
BAG ZIPLOCK 12X15 (MISCELLANEOUS) ×3 IMPLANT
CLEANER TIP ELECTROSURG 2X2 (MISCELLANEOUS) ×3 IMPLANT
CLOSURE WOUND 1/2 X4 (GAUZE/BANDAGES/DRESSINGS) ×1
CLOTH 2% CHLOROHEXIDINE 3PK (PERSONAL CARE ITEMS) ×3 IMPLANT
DRAPE MICROSCOPE LEICA (MISCELLANEOUS) ×3 IMPLANT
DRAPE POUCH INSTRU U-SHP 10X18 (DRAPES) ×3 IMPLANT
DRAPE SHEET LG 3/4 BI-LAMINATE (DRAPES) ×3 IMPLANT
DRAPE SURG 17X11 SM STRL (DRAPES) ×3 IMPLANT
DRAPE UTILITY XL STRL (DRAPES) ×3 IMPLANT
DRSG AQUACEL AG ADV 3.5X 4 (GAUZE/BANDAGES/DRESSINGS) ×3 IMPLANT
DURAPREP 26ML APPLICATOR (WOUND CARE) ×3 IMPLANT
DURASEAL SPINE SEALANT 3ML (MISCELLANEOUS) IMPLANT
ELECT BLADE TIP CTD 4 INCH (ELECTRODE) IMPLANT
ELECT REM PT RETURN 9FT ADLT (ELECTROSURGICAL) ×3
ELECTRODE REM PT RTRN 9FT ADLT (ELECTROSURGICAL) ×1 IMPLANT
GLOVE BIOGEL PI IND STRL 7.0 (GLOVE) ×1 IMPLANT
GLOVE BIOGEL PI IND STRL 7.5 (GLOVE) ×2 IMPLANT
GLOVE BIOGEL PI INDICATOR 7.0 (GLOVE) ×2
GLOVE BIOGEL PI INDICATOR 7.5 (GLOVE) ×4
GLOVE SURG SS PI 7.0 STRL IVOR (GLOVE) ×3 IMPLANT
GLOVE SURG SS PI 7.5 STRL IVOR (GLOVE) ×6 IMPLANT
GLOVE SURG SS PI 8.0 STRL IVOR (GLOVE) ×6 IMPLANT
GOWN STRL REUS W/ TWL XL LVL3 (GOWN DISPOSABLE) ×1 IMPLANT
GOWN STRL REUS W/TWL XL LVL3 (GOWN DISPOSABLE) ×8 IMPLANT
IV CATH 14GX2 1/4 (CATHETERS) ×3 IMPLANT
KIT BASIN OR (CUSTOM PROCEDURE TRAY) ×3 IMPLANT
KIT POSITIONING SURG ANDREWS (MISCELLANEOUS) ×3 IMPLANT
MANIFOLD NEPTUNE II (INSTRUMENTS) ×3 IMPLANT
NEEDLE SPNL 18GX3.5 QUINCKE PK (NEEDLE) ×6 IMPLANT
PACK LAMINECTOMY ORTHO (CUSTOM PROCEDURE TRAY) ×3 IMPLANT
PATTIES SURGICAL .5 X.5 (GAUZE/BANDAGES/DRESSINGS) IMPLANT
PATTIES SURGICAL .75X.75 (GAUZE/BANDAGES/DRESSINGS) IMPLANT
PATTIES SURGICAL 1X1 (DISPOSABLE) IMPLANT
RUBBERBAND STERILE (MISCELLANEOUS) ×6 IMPLANT
SPONGE SURGIFOAM ABS GEL 100 (HEMOSTASIS) ×3 IMPLANT
STAPLER VISISTAT (STAPLE) IMPLANT
STRIP CLOSURE SKIN 1/2X4 (GAUZE/BANDAGES/DRESSINGS) ×2 IMPLANT
SUT NURALON 4 0 TR CR/8 (SUTURE) IMPLANT
SUT PROLENE 3 0 PS 2 (SUTURE) ×3 IMPLANT
SUT VIC AB 1 CT1 27 (SUTURE)
SUT VIC AB 1 CT1 27XBRD ANTBC (SUTURE) IMPLANT
SUT VIC AB 1-0 CT2 27 (SUTURE) ×3 IMPLANT
SUT VIC AB 2-0 CT1 27 (SUTURE)
SUT VIC AB 2-0 CT1 TAPERPNT 27 (SUTURE) IMPLANT
SUT VIC AB 2-0 CT2 27 (SUTURE) ×3 IMPLANT
SYR 3ML LL SCALE MARK (SYRINGE) IMPLANT
TOWEL OR 17X26 10 PK STRL BLUE (TOWEL DISPOSABLE) ×3 IMPLANT
TOWEL OR NON WOVEN STRL DISP B (DISPOSABLE) IMPLANT
TRAY FOLEY CATH 16FR SILVER (SET/KITS/TRAYS/PACK) ×3 IMPLANT
YANKAUER SUCT BULB TIP NO VENT (SUCTIONS) IMPLANT

## 2016-07-23 NOTE — Discharge Instructions (Signed)
Walk As Tolerated utilizing back precautions.  No bending, twisting, or lifting.  No driving for 2 weeks.   °Aquacel dressing may remain in place until follow up. May shower with aquacel dressing in place. If the dressing peels off or becomes saturated, you may remove aquacel dressing and place gauze and tape dressing which should be kept clean and dry and changed daily. Do not remove steri-strips if they are present. °See Dr. Kay Shippy in office in 10 to 14 days. Begin taking aspirin 81mg per day starting 4 days after your surgery if not allergic to aspirin or on another blood thinner. °Walk daily even outside. Use a cane or walker only if necessary. °Avoid sitting on soft sofas. ° °

## 2016-07-23 NOTE — Interval H&P Note (Signed)
History and Physical Interval Note:  07/23/2016 8:28 AM  Christian Wade  has presented today for surgery, with the diagnosis of HNP L2 - L3  The various methods of treatment have been discussed with the patient and family. After consideration of risks, benefits and other options for treatment, the patient has consented to  Procedure(s): MICRO LUMBAR DECOMPRESSION L2 - L3 ON THE LEFT  1 LEVEL (Left) as a surgical intervention .  The patient's history has been reviewed, patient examined, no change in status, stable for surgery.  I have reviewed the patient's chart and labs.  Questions were answered to the patient's satisfaction.     [name] C

## 2016-07-23 NOTE — Anesthesia Postprocedure Evaluation (Signed)
Anesthesia Post Note  Patient: Christian RangeEdwin Cubero Wade  Procedure(s) Performed: Procedure(s) (LRB): MICRO LUMBAR DECOMPRESSION L2 - L3 BILATERALLY, 1 LEVEL AND MICRODISCECTOMY (Bilateral)  Patient location during evaluation: PACU Anesthesia Type: General Level of consciousness: sedated Pain management: pain level controlled Vital Signs Assessment: post-procedure vital signs reviewed and stable Respiratory status: spontaneous breathing and respiratory function stable Cardiovascular status: stable Anesthetic complications: no Comments: Pt had an episode of low SaO2 prior to extubation in the OR.  Once it resolved, he remained 100%.  CXR and EKG were unchanged.     Last Vitals:  Vitals:   07/23/16 1145 07/23/16 1200  BP: (!) 139/96   Pulse: 87 87  Resp: 20 (!) 21  Temp:  36.9 C    Last Pain:  Vitals:   07/23/16 1130  TempSrc:   PainSc: 10-Worst pain ever    LLE Motor Response: Purposeful movement (07/23/16 1200) LLE Sensation: Full sensation (07/23/16 1200) RLE Motor Response: Purposeful movement (07/23/16 1200) RLE Sensation: Full sensation (07/23/16 1200)      [name] DANIEL

## 2016-07-23 NOTE — Brief Op Note (Signed)
07/23/2016  10:03 AM  PATIENT:  Christian Wade  50 y.o. male  PRE-OPERATIVE DIAGNOSIS:  HNP L2 - L3  POST-OPERATIVE DIAGNOSIS:  HNP L2 - L3  PROCEDURE:  Procedure(s): MICRO LUMBAR DECOMPRESSION L2 - L3 BILATERALLY, 1 LEVEL AND MICRODISCECTOMY (Bilateral)  SURGEON:  Surgeon(s) and Role:    * Jene EveryJeffrey Imogen Maddalena, MD - Primary  PHYSICIAN ASSISTANT:   ASSISTANTS: Biss[place]   ANESTHESIA:   general  EBL:  Total I/O In: 1000 [I.V.:1000] Out: 125 [Urine:100; Blood:25]  BLOOD ADMINISTERED:none  DRAINS: none   LOCAL MEDICATIONS USED:  XYLOCAINE   SPECIMEN:  No Specimen  DISPOSITION OF SPECIMEN:  N/A  COUNTS:  YES and NO yes  TOURNIQUET:  * No tourniquets in log *  DICTATION: .Other Dictation: Dictation Number [id number]  PLAN OF CARE: Admit for overnight observation  PATIENT DISPOSITION:  PACU - hemodynamically stable.   Delay start of Pharmacological VTE agent (>24hrs) due to surgical blood loss or risk of bleeding: yes

## 2016-07-23 NOTE — H&P (View-Only) (Signed)
Christian Wade is an 50 y.o. male.   Chief Complaint: back and L leg pain HPI: The patient is a 50 year old male who presents today for follow up of their back. The patient is being followed for their back pain. They are now 12 weeks and 5 days out from injury (DOI 01/02/16). Symptoms reported today include: pain. Current treatment includes: physical therapy, activity modification, pain medications and Neurontin and Voltaren Gel. The following medication has been used for pain control: Percocet. The patient reports their current pain level to be mild (to severe). The patient has not gotten any relief of their symptoms with physical therapy. Note for "Follow-up back": The patient was placed on a 10lb. lifting restriction, no bending, stooping, or squatting, and no prolonged sitting or standing. NCM Christian Wade.  Christian Wade follows up. He is having persistent lower extremity radicular pain, worse with activity, better with rest. It will radiate down into the leg. He has taken Percocet and gabapentin. He is on a light duty position. His case manager is Christian Wade.  He reports continued pain and weakness into the left leg. He is unable to fully flex.  He is having numbness down the anterior aspect of the leg as well. He has mid thoracic pain as well.  Past Medical History:  Diagnosis Date  . Hypertension     Past Surgical History:  Procedure Laterality Date  . APPENDECTOMY      No family history on file. Social History:  reports that he has never smoked. He does not have any smokeless tobacco history on file. He reports that he does not drink alcohol. His drug history is not on file.  Allergies: No Known Allergies   (Not in a hospital admission)  No results found for this or any previous visit (from the past 48 hour(s)). No results found.  Review of Systems  Constitutional: Negative.   HENT: Negative.   Eyes: Negative.   Respiratory: Negative.   Cardiovascular:  Negative.   Gastrointestinal: Negative.   Genitourinary: Negative.   Musculoskeletal: Positive for back pain.  Skin: Negative.   Neurological: Positive for sensory change and focal weakness.  Psychiatric/Behavioral: Negative.     There were no vitals taken for this visit. Physical Exam  Constitutional: He is oriented to person, place, and time. He appears well-developed. He appears distressed.  HENT:  Head: Normocephalic.  Eyes: Pupils are equal, round, and reactive to light.  Neck: Normal range of motion.  Cardiovascular: Normal rate.   Respiratory: Effort normal.  GI: Soft.  Musculoskeletal:  On exam, he is in moderate distress. Mood and affect is appropriate, leaning to the right. His femoral stretch is positive. He has 4/5 hip flexor strength as well as quad strength on the left. Decreased sensation in the L3 dermatome. Lumbar spine exam reveals no evidence of soft tissue swelling, deformity or skin ecchymosis. On palpation there is no tenderness of the lumbar spine. No flank pain with percussion. The abdomen is soft and nontender. Nontender over the trochanters. No cellulitis or lymphadenopathy.  Good range of motion of the lumbar spine without associated pain. Straight leg raise is negative. Motor is 5/5 including EHL, tibialis anterior, plantar flexion, quadriceps and hamstrings. Patient is normoreflexic. There is no Babinski or clonus. Sensory exam is intact to light touch. Patient has good distal pulses. No DVT. No pain and normal range of motion without instability of the hips, knees and ankles.  Neurological: He is alert and oriented to person,  place, and time.  Skin: Skin is warm.    Again, MRI demonstrates an extruded fragment from the L2-3 disc inferior to the L3 pedicle compressing the L3 nerve root. He has decreased knee reflex.  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  Extensive discussion concerning current pathology, relevant anatomy, and treatment options for 45 minutes dedicated in this, discussion with his translator and utilizing PowerPoint presentation and illustrated handout. We discussed living with his symptoms because he is at maximum medical improvement versus proceeding with lumbar decompression. He does have an extruded fragment and a neurologic deficit. We discussed micro lumbar decompression at L2-3 on the left. I had an extensive discussion of the risks and benefits of the lumbar decompression with the patient including bleeding, infection, damage to neurovascular structures, epidural fibrosis, CSF leak requiring repair. We also discussed increase in pain, adjacent segment disease, recurrent disc herniation, need for future surgery including repeat decompression and/or fusion. We also discussed risks of postoperative hematoma, paralysis, anesthetic complications including DVT, PE, death, cardiopulmonary dysfunction. In addition, the perioperative and postoperative courses were discussed in detail including the rehabilitative time and return to functional activity and work. I provided the patient with an illustrated handout and utilized the appropriate surgical models.  There is no history of tobacco use, negative myocardial infarction in the family or diabetes. He has hypertension. His brother actually had a stroke with hypertension at an early age. He takes a blood pressure pill. He does not currently have a dedicated family medicine physician. We will check his blood pressure and have him go for treatment for that right now to the clinic and proceed accordingly. Once he is normotensive, we can proceed with lumbar decompression. We may have to get Workmen's Comp to authorize a visit for him, but we will try that ASAP. I had an extensive discussion of the risks  and benefits of the lumbar decompression with the patient including bleeding, infection, damage to neurovascular structures, epidural fibrosis, CSF leak requiring repair. We also discussed increase in pain, adjacent segment disease, recurrent disc herniation, need for future surgery including repeat decompression and/or fusion. We also discussed risks of postoperative hematoma, paralysis, anesthetic complications including DVT, PE, death, cardiopulmonary dysfunction. In addition, the perioperative and postoperative courses were discussed in detail including the rehabilitative time and return to functional activity and work. I provided the patient with an illustrated handout and utilized the appropriate surgical models.  Overnight in the hospital, two weeks for suture removal, six weeks until physical therapy until light duty is available, three months of maximum medical improvement, six weeks of work conditioning. Continue on his Percocet, Voltaren, and Neurontin. No lifting over 10 pounds, repetitive bending, prolonged standing or sitting.  Plan microlumbar decompression L2-3 left  BISSELL, Dayna BarkerJACLYN M., PA-C for Dr. Shelle IronBeane 07/15/2016, 11:13 AM

## 2016-07-23 NOTE — Transfer of Care (Signed)
Immediate Anesthesia Transfer of Care Note  Patient: Christian Wade  Procedure(s) Performed: Procedure(s): MICRO LUMBAR DECOMPRESSION L2 - L3 BILATERALLY, 1 LEVEL AND MICRODISCECTOMY (Bilateral)  Patient Location: PACU  Anesthesia Type:General  Level of Consciousness: sedated, patient cooperative and responds to stimulation  Airway & Oxygen Therapy: Patient Spontanous Breathing and Patient connected to face mask oxygen  Post-op Assessment: Report given to RN and Post -op Vital signs reviewed and stable  Post vital signs: Reviewed and stable  Last Vitals:  Vitals:   07/23/16 0646  BP: (!) 156/115  Pulse: 99  Resp: 18  Temp: 36.8 C    Last Pain:  Vitals:   07/23/16 0646  TempSrc: Oral  PainSc:       Patients Stated Pain Goal: 5 (07/23/16 0629)  Complications: No apparent anesthesia complications

## 2016-07-23 NOTE — Anesthesia Procedure Notes (Signed)
Procedure Name: Intubation Performed by: Kizzie FantasiaARVER, Panzy Bubeck J Pre-anesthesia Checklist: Patient identified, Emergency Drugs available, Suction available, Patient being monitored and Timeout performed Patient Re-evaluated:Patient Re-evaluated prior to inductionOxygen Delivery Method: Circle system utilized Preoxygenation: Pre-oxygenation with 100% oxygen Intubation Type: IV induction Ventilation: Mask ventilation without difficulty Laryngoscope Size: Mac and 4 Grade View: Grade II Number of attempts: 1 Airway Equipment and Method: Stylet Secured at: 23 cm Tube secured with: Tape Dental Injury: Teeth and Oropharynx as per pre-operative assessment

## 2016-07-24 DIAGNOSIS — M5126 Other intervertebral disc displacement, lumbar region: Secondary | ICD-10-CM | POA: Diagnosis not present

## 2016-07-24 NOTE — Progress Notes (Signed)
Subjective: 1 Day Post-Op Procedure(s) (LRB): MICRO LUMBAR DECOMPRESSION L2 - L3 BILATERALLY, 1 LEVEL AND MICRODISCECTOMY (Bilateral) Patient reports pain as moderate.  Reports leg pain is improved. Noting incisional back pain. No other c/o. Seen by Dr. Shelle IronBeane in AM rounds.  Objective: Vital signs in last 24 hours: Temp:  [97.5 F (36.4 C)-99.1 F (37.3 C)] 99.1 F (37.3 C) (12/07 0516) Pulse Rate:  [81-104] 104 (12/07 0815) Resp:  [16-22] 18 (12/07 0516) BP: (124-155)/(83-104) 154/98 (12/07 0815) SpO2:  [93 %-100 %] 95 % (12/07 0516)  Intake/Output from previous day: 12/06 0701 - 12/07 0700 In: 3785 [P.O.:1380; I.V.:2150; IV Piggyback:255] Out: 2250 [Urine:2225; Blood:25] Intake/Output this shift: No intake/output data recorded.   Recent Labs  07/22/16 1443  HGB 16.5    Recent Labs  07/22/16 1443  WBC 11.5*  RBC 5.33  HCT 47.1  PLT 278    Recent Labs  07/22/16 1443  NA 138  K 3.9  CL 104  CO2 25  BUN 21*  CREATININE 0.91  GLUCOSE 95  CALCIUM 8.8*   No results for input(s): LABPT, INR in the last 72 hours.  Neurologically intact ABD soft Neurovascular intact Sensation intact distally Intact pulses distally Dorsiflexion/Plantar flexion intact Incision: dressing C/D/I and no drainage No cellulitis present Compartment soft no calf pain or sign of DVT  Assessment/Plan: 1 Day Post-Op Procedure(s) (LRB): MICRO LUMBAR DECOMPRESSION L2 - L3 BILATERALLY, 1 LEVEL AND MICRODISCECTOMY (Bilateral) Advance diet Up with therapy D/C IV fluids  Discussed D/C instructions, dressing instructions, Lspine precautions D/C later today after PT as long as pain remains well controlled Follow up 10-14 days post-op in office for suture removal  BISSELL, JACLYN M. 07/24/2016, 8:46 AM

## 2016-07-24 NOTE — Discharge Summary (Signed)
Physician Discharge Summary   Patient ID: Christian Wade MRN: [id number] DOB/AGE: [birth year] 50 y.o.  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 Hypertension    Discharge Diagnoses:   Active Problems:   Spinal stenosis of lumbar region  Procedure:  Procedure(s) (LRB): MICRO LUMBAR DECOMPRESSION L2 - L3 BILATERALLY, 1 LEVEL AND MICRODISCECTOMY (Bilateral)   Consults: None  HPI:  see H&P    Laboratory Data: Hospital Outpatient Visit on 07/22/2016  Component Date Value Ref Range Status  . Sodium 07/22/2016 138  135 - 145 mmol/L Final  . Potassium 07/22/2016 3.9  3.5 - 5.1 mmol/L Final  . Chloride 07/22/2016 104  101 - 111 mmol/L Final  . CO2 07/22/2016 25  22 - 32 mmol/L Final  . Glucose, Bld 07/22/2016 95  65 - 99 mg/dL Final  . BUN 07/22/2016 21* 6 - 20 mg/dL Final  . Creatinine, Ser 07/22/2016 0.91  0.61 - 1.24 mg/dL Final  . Calcium 07/22/2016 8.8* 8.9 - 10.3 mg/dL Final  . GFR calc non Af Amer 07/22/2016 >60  >60 mL/min Final  . GFR calc Af Amer 07/22/2016 >60  >60 mL/min Final   Comment: (NOTE) The eGFR has been calculated using the CKD EPI equation. This calculation has not been validated in all clinical situations. eGFR's persistently <60 mL/min signify possible Chronic Kidney Disease.   . Anion gap 07/22/2016 9  5 - 15 Final  . WBC 07/22/2016 11.5* 4.0 - 10.5 K/uL Final  . RBC 07/22/2016 5.33  4.22 - 5.81 MIL/uL Final  . Hemoglobin 07/22/2016 16.5  13.0 - 17.0 g/dL Final  . HCT 07/22/2016 47.1  39.0 - 52.0 % Final  . MCV 07/22/2016 88.4  78.0 - 100.0 fL Final  . MCH 07/22/2016 31.0  26.0 - 34.0 pg Final  . MCHC 07/22/2016 35.0  30.0 - 36.0 g/dL Final  . RDW 07/22/2016 13.1  11.5 - 15.5 % Final  . Platelets 07/22/2016 278  150 - 400 K/uL Final  . ABO/RH(D) 07/23/2016 O POS   Final  . Antibody Screen 07/23/2016 NEG   Final  . Sample Expiration  07/23/2016 07/26/2016   Final  . Extend sample reason 07/23/2016 NO TRANSFUSIONS OR PREGNANCY IN THE PAST 3 MONTHS   Final  . MRSA, PCR 07/22/2016 NEGATIVE  NEGATIVE Final  . Staphylococcus aureus 07/22/2016 NEGATIVE  NEGATIVE Final   Comment:        The Xpert SA Assay (FDA approved for NASAL specimens in patients over 63 years of age), is one component of a comprehensive surveillance program.  Test performance has been validated by [facility] for patients greater than or equal to 24 year old. It is not intended to diagnose infection nor to guide or monitor treatment.   . ABO/RH(D) 07/22/2016 O POS   Final    Recent Labs  07/22/16 1443  HGB 16.5    Recent Labs  07/22/16 1443  WBC 11.5*  RBC 5.33  HCT 47.1  PLT 278    Recent Labs  07/22/16 1443  NA 138  K 3.9  CL 104  CO2 25  BUN 21*  CREATININE 0.91  GLUCOSE 95  CALCIUM 8.8*   No results for input(s): LABPT, INR in the last 72 hours.  X-Rays:Dg Lumbar Spine 2-3 Views  Result Date: 07/22/2016 CLINICAL DATA:  Preop for lumbar surgery. Lower back pain for several months. EXAM:  LUMBAR SPINE - 2-3 VIEW COMPARISON:  None. FINDINGS: No fracture or spondylolisthesis is noted. Mild degenerative disc disease is noted at L2-3 and L3-4 with anterior osteophyte formation at these levels. IMPRESSION: Mild multilevel degenerative disc disease. No acute abnormality seen in the lumbar spine. Electronically Signed   By: Marijo Conception, M.D.   On: 07/22/2016 15:37   Dg Chest Port 1 View  Result Date: 07/23/2016 CLINICAL DATA:  50 year old male status post spine surgery with oxygen desaturation. Initial encounter. EXAM: PORTABLE CHEST 1 VIEW COMPARISON:  Lumbar intraoperative images today. FINDINGS: Portable AP semi upright views at 1040 hours. Low lung volumes despite a repeat image. Allowing for portable technique the lungs are clear. Normal cardiac size and mediastinal contours. Visualized tracheal air column is within normal  limits. No pneumothorax or pleural effusion identified. IMPRESSION: Low lung volumes, otherwise no acute cardiopulmonary abnormality. Electronically Signed   By: Genevie Ann M.D.   On: 07/23/2016 11:13   Dg Spine Portable 1 View  Result Date: 07/23/2016 CLINICAL DATA:  Surgery. EXAM: PORTABLE SPINE - 1 VIEW COMPARISON:  07/23/2016. FINDINGS: Lumbar vertebra numbered with the lowest segmented appearing lumbar shaped vertebral lateral view as L5. Metallic markers noted posteriorly over L3. No acute bony abnormality normal alignment mineralization. IMPRESSION: Metallic markers noted posteriorly over L3. Electronically Signed   By: Marcello Moores  Register   On: 07/23/2016 09:57   Dg Spine Portable 1 View  Result Date: 07/23/2016 CLINICAL DATA:  Intraoperative lumbar localization EXAM: PORTABLE SPINE - 1 VIEW COMPARISON:  Film from earlier in the same day FINDINGS: A surgical instrument and retractor are now seen posterior to the L2-3 interspace. The numbering nomenclature is similar to that utilized on the prior exam. IMPRESSION: Surgical instrument at the L2-3 interspace. Electronically Signed   By: Inez Catalina M.D.   On: 07/23/2016 09:24   Dg Spine Portable 1 View  Result Date: 07/23/2016 CLINICAL DATA:  L2-3 surgery. EXAM: PORTABLE SPINE - 1 VIEW COMPARISON:  07/22/2016 FINDINGS: Spinous processes are numbered in correlation with the previous plain radiographs. There are 2 visible needles, 1 directed at the L2-3 interspinous space in the other directed at the L3-4 interspinous space. IMPRESSION: L2-3 and L3-4 interspinous spaces localized. Electronically Signed   By: Nelson Chimes M.D.   On: 07/23/2016 09:10    EKG: Orders placed or performed during the hospital encounter of 07/23/16  . EKG 12-Lead  . EKG 12-Lead  . EKG 12-Lead  . EKG 12-Lead     Hospital Course: Patient was admitted to [facility] and taken to the OR and underwent the above state procedure without complications.  Patient  tolerated the procedure well and was later transferred to the recovery room and then to the orthopaedic floor for postoperative care.  They were given PO and IV analgesics for pain control following their surgery.  They were given 24 hours of postoperative antibiotics.   PT was consulted postop to assist with mobility and transfers.  The patient was allowed to be WBAT with therapy and was taught back precautions. Discharge planning was consulted to help with postop disposition and equipment needs.  Patient had a good night on the evening of surgery and started to get up OOB with therapy on day one. Patient was seen in rounds and was ready to go home on day one.  They were given discharge instructions and dressing directions.  They were instructed on when to follow up in the office with Dr. Tonita Cong.  Diet: Regular diet Activity:WBAT; Lspine precautions Follow-up:in 10-14 days Disposition - Home Discharged Condition: good   Discharge Instructions    Call MD / Call 911    Complete by:  As directed    If you experience chest pain or shortness of breath, CALL 911 and be transported to the hospital emergency room.  If you develope a fever above 101 F, pus (white drainage) or increased drainage or redness at the wound, or calf pain, call your surgeon's office.   Constipation Prevention    Complete by:  As directed    Drink plenty of fluids.  Prune juice may be helpful.  You may use a stool softener, such as Colace (over the counter) 100 mg twice a day.  Use MiraLax (over the counter) for constipation as needed.   Diet - low sodium heart healthy    Complete by:  As directed    Increase activity slowly as tolerated    Complete by:  As directed        Medication List    TAKE these medications   amLODipine 10 MG tablet Commonly known as:  NORVASC Take 10 mg by mouth daily.   docusate sodium 100 MG capsule Commonly known as:  COLACE Take 1 capsule (100 mg total) by mouth 2 (two) times daily as  needed for mild constipation.   gabapentin 300 MG capsule Commonly known as:  NEURONTIN Take 300 mg by mouth 3 (three) times daily as needed (pain).   lisinopril 10 MG tablet Commonly known as:  PRINIVIL,ZESTRIL Take 10 mg by mouth daily.   methocarbamol 500 MG tablet Commonly known as:  ROBAXIN Take 1 tablet (500 mg total) by mouth every 6 (six) hours as needed for muscle spasms.   oxyCODONE-acetaminophen 5-325 MG tablet Commonly known as:  PERCOCET Take 1-2 tablets by mouth every 4 (four) hours as needed for severe pain. What changed:  how much to take  when to take this   polyethylene glycol packet Commonly known as:  MIRALAX / GLYCOLAX Take 17 g by mouth daily.      Follow-up Information    BEANE,JEFFREY C, MD In 2 weeks.   Specialty:  Orthopedic Surgery Contact information: [address] [place] [address] [phone]           Signed: Lacie Draft, PA-C Orthopaedic Surgery 07/24/2016, 8:48 AM

## 2016-07-24 NOTE — Progress Notes (Signed)
Occupational Therapy Treatment Patient Details Name: Christian Wade MRN: [id number] DOB: 02/27/1966 Today's Date: 07/24/2016    History of present illness s/p L2-3 decompression      Follow Up Recommendations  Supervision/Assistance - 24 hour    Equipment Recommendations  3 in 1 bedside commode       Precautions / Restrictions Precautions Precautions: Back       Mobility Bed Mobility   Bed Mobility: Sit to Supine Rolling: Min assist Sidelying to sit: Min assist   Sit to supine: Min assist   General bed mobility comments: cues for back precautions  Transfers Overall transfer level: Needs assistance Equipment used: Rolling walker (2 wheeled) Transfers: Sit to/from UGI CorporationStand;Stand Pivot Transfers Sit to Stand: Min assist Stand pivot transfers: Min assist       General transfer comment: cues for technique; +2 safety.  Pt c/o dizziness:  BP 159/104        ADL Overall ADL's : Needs assistance/impaired     Grooming: Set up;Sitting;Wash/dry face       Lower Body Bathing: Maximal assistance;Sit to/from stand   Upper Body Dressing : Minimal assistance;Sitting   Lower Body Dressing: Total assistance;Sit to/from stand   Toilet Transfer: Minimal assistance;Ambulation;BSC;RW   Toileting- ArchitectClothing Manipulation and Hygiene: Min guard;Sit to/from stand         General ADL Comments: AE issued.  Back precaution education continued      Vision                     Perception     Praxis      Cognition   Behavior During Therapy: WFL for tasks assessed/performed Overall Cognitive Status: Within Functional Limits for tasks assessed                       Extremity/Trunk Assessment  Upper Extremity Assessment Upper Extremity Assessment: Defer to OT evaluation   Lower Extremity Assessment Lower Extremity Assessment: Generalized weakness        Exercises     Shoulder Instructions       General Comments      Pertinent Vitals/  Pain       Faces Pain Scale: Hurts whole lot Pain Location: back Pain Descriptors / Indicators: Sore;Aching Pain Intervention(s): Monitored during session;Relaxation;Patient requesting pain meds-RN notified  Home Living                                          Prior Functioning/Environment              Frequency  Min 2X/week        Progress Toward Goals  OT Goals(current goals can now be found in the care plan section)     Acute Rehab OT Goals Patient Stated Goal: none stated  Plan Discharge plan remains appropriate    Co-evaluation      Reason for Co-Treatment: For patient/therapist safety PT goals addressed during session: Mobility/safety with mobility OT goals addressed during session: ADL's and self-care      End of Session Equipment Utilized During Treatment: Rolling walker   Activity Tolerance Patient tolerated treatment well   Patient Left in bed;with call bell/phone within reach;with family/visitor present   Nurse Communication Mobility status    Functional Assessment Tool Used: clinical judgment and observation Functional Limitation: Self care Self Care Current Status (N0272(G8987): At least 40 percent  but less than 60 percent impaired, limited or restricted Self Care Goal Status (Z6109(G8988): At least 1 percent but less than 20 percent impaired, limited or restricted   Time: 1315-1336 OT Time Calculation (min): 21 min  Charges: OT G-codes **NOT FOR INPATIENT CLASS** Functional Assessment Tool Used: clinical judgment and observation Functional Limitation: Self care Self Care Current Status (U0454(G8987): At least 40 percent but less than 60 percent impaired, limited or restricted Self Care Goal Status (U9811(G8988): At least 1 percent but less than 20 percent impaired, limited or restricted OT General Charges $OT Visit: 1 Procedure OT Treatments $Self Care/Home Management : 8-22 mins  Christian Wade, Christian Wade 07/24/2016, 2:12 PM

## 2016-07-24 NOTE — Evaluation (Signed)
Occupational Therapy Evaluation Patient Details Name: Christian Wade Today's Date: 07/24/2016    History of Present Illness s/p L2-3 decompression   Clinical Impression   This 50 year old man was admitted for the above sx.  He will benefit from continued OT to reinforce back precaution education during adls.  Pt was limited by pain during evaluation, and needed up to total A for LB adls.  Goals are for min A.    Follow Up Recommendations  Supervision/Assistance - 24 hour    Equipment Recommendations  3 in 1 bedside comode    Recommendations for Other Services       Precautions / Restrictions Precautions Precautions: Back Restrictions Weight Bearing Restrictions: No      Mobility Bed Mobility Overal bed mobility: Needs Assistance Bed Mobility: Rolling;Sidelying to Sit Rolling: Min assist Sidelying to sit: Mod assist       General bed mobility comments: cues for back precautions  Transfers Overall transfer level: Needs assistance   Transfers: Sit to/from Stand Sit to Stand: Min assist;+2 safety/equipment         General transfer comment: cues for technique; +2 safety.  Pt c/o dizziness:  BP 159/104    Balance                                            ADL Overall ADL's : Needs assistance/impaired     Grooming: Set up;Sitting;Wash/dry face   Upper Body Bathing: Minimal assitance;Sitting   Lower Body Bathing: Maximal assistance;Sit to/from stand   Upper Body Dressing : Minimal assistance;Sitting   Lower Body Dressing: Total assistance;Sit to/from stand   Toilet Transfer: Minimal assistance;Ambulation;BSC;RW   Toileting- ArchitectClothing Manipulation and Hygiene: Min guard;Sit to/from stand         General ADL Comments: educated on back precautions. Son present. Showed AE, but pt did not practice. Demonstrated tub transfer technique, but pt is not ready.  Limited by pain and needs +2 to scoot  forward and backwards     Vision     Perception     Praxis      Pertinent Vitals/Pain Pain Assessment: Faces Faces Pain Scale: Hurts whole lot Pain Location: back Pain Descriptors / Indicators: Aching Pain Intervention(s): Limited activity within patient's tolerance;Monitored during session;Premedicated before session;Repositioned     Hand Dominance     Extremity/Trunk Assessment Upper Extremity Assessment Upper Extremity Assessment: Overall WFL for tasks assessed           Communication Communication Communication: Prefers language other than English ([place]; son interpreted)   Cognition Arousal/Alertness: Awake/alert Behavior During Therapy: WFL for tasks assessed/performed Overall Cognitive Status: Within Functional Limits for tasks assessed                     General Comments       Exercises       Shoulder Instructions      Home Living Family/patient expects to be discharged to:: Private residence Living Arrangements: Parent;Children Available Help at Discharge: Family;Available 24 hours/day   Home Access: Stairs to enter Entrance Stairs-Number of Steps: 3 Entrance Stairs-Rails: Can reach both       Bathroom Shower/Tub: Tub/shower unit Shower/tub characteristics: Engineer, building servicesCurtain Bathroom Toilet: Handicapped height     Home Equipment: None          Prior Functioning/Environment Level of Independence: Independent  OT Problem List: Decreased strength;Decreased activity tolerance;Pain;Decreased knowledge of use of DME or AE;Decreased knowledge of precautions   OT Treatment/Interventions: Self-care/ADL training;DME and/or AE instruction;Patient/family education    OT Goals(Current goals can be found in the care plan section) Acute Rehab OT Goals Patient Stated Goal: none stated OT Goal Formulation: With patient/family Time For Goal Achievement: 07/26/16 Potential to Achieve Goals: Good ADL Goals Pt Will Perform Lower  Body Bathing: with min assist;with adaptive equipment;sit to/from stand Pt Will Perform Lower Body Dressing: with min assist;with adaptive equipment;sit to/from stand Pt Will Transfer to Toilet: with min guard assist;ambulating;bedside commode Pt Will Perform Toileting - Clothing Manipulation and hygiene: with min guard assist;sit to/from stand (including sit to stand) Additional ADL Goal #1: pt will verbalize back precautions  OT Frequency: Min 2X/week   Barriers to D/C:            Co-evaluation PT/OT/SLP Co-Evaluation/Treatment: Yes Reason for Co-Treatment: For patient/therapist safety PT goals addressed during session: Mobility/safety with mobility OT goals addressed during session: ADL's and self-care      End of Session Nurse Communication: Mobility status;Patient requests pain meds  Activity Tolerance: Patient tolerated treatment well Patient left: in chair;with call bell/phone within reach;with chair alarm set;with family/visitor present   Time: 0724-0802 OT Time Calculation (min): 38 min Charges:  OT General Charges $OT Visit: 1 Procedure OT Evaluation $OT Eval Low Complexity: 1 Procedure G-Codes: OT G-codes **NOT FOR INPATIENT CLASS** Functional Assessment Tool Used: clinical judgment and observation Functional Limitation: Self care Self Care Current Status (Z6109(G8987): At least 80 percent but less than 100 percent impaired, limited or restricted Self Care Goal Status (U0454(G8988): At least 20 percent but less than 40 percent impaired, limited or restricted  Christian Wade 07/24/2016, 8:14 AM Christian Wade, OTR/L [phone] 07/24/2016

## 2016-07-24 NOTE — Evaluation (Signed)
Physical Therapy Evaluation Patient Details Name: Christian Wade MRN: [id number] DOB: 01/24/1966 Today's Date: 07/24/2016   History of Present Illness  s/p L2-3 decompression  Clinical Impression  The patient required extra time to mobilize to sitting.aMBULATION WAS TOLERATED AFTER INITIAL INCREA E IN PAIN. Pt admitted with above diagnosis. Pt currently with functional limitations due to the deficits listed below (see PT Problem List).  Pt will benefit from skilled PT to increase their independence and safety with mobility to allow discharge to the venue listed below.      Follow Up Recommendations No PT follow up    Equipment Recommendations  Rolling walker with 5" wheels    Recommendations for Other Services       Precautions / Restrictions Precautions Precautions: Back Restrictions Weight Bearing Restrictions: No      Mobility  Bed Mobility Overal bed mobility: Needs Assistance Bed Mobility: Rolling;Sidelying to Sit Rolling: Min assist Sidelying to sit: Mod assist       General bed mobility comments: cues for back precautions  Transfers Overall transfer level: Needs assistance   Transfers: Sit to/from Stand Sit to Stand: Min assist;+2 safety/equipment         General transfer comment: cues for technique; +2 safety.  Pt c/o dizziness:  BP 159/104  Ambulation/Gait Ambulation/Gait assistance: Min assist Ambulation Distance (Feet): 200 Feet Assistive device: Rolling walker (2 wheeled) Gait Pattern/deviations: Step-to pattern;Step-through pattern Gait velocity: slow   General Gait Details: gradual improvment of gait and posture.   Stairs            Wheelchair Mobility    Modified Rankin (Stroke Patients Only)       Balance                                             Pertinent Vitals/Pain Pain Assessment: Faces Faces Pain Scale: Hurts whole lot Pain Location: back Pain Descriptors / Indicators: Aching Pain  Intervention(s): Limited activity within patient's tolerance;Monitored during session;Premedicated before session;Repositioned    Home Living Family/patient expects to be discharged to:: Private residence Living Arrangements: Parent;Children Available Help at Discharge: Family;Available 24 hours/day   Home Access: Stairs to enter Entrance Stairs-Rails: Can reach both Entrance Stairs-Number of Steps: 3   Home Equipment: None      Prior Function Level of Independence: Independent               Hand Dominance        Extremity/Trunk Assessment   Upper Extremity Assessment: Defer to OT evaluation           Lower Extremity Assessment: Generalized weakness         Communication   Communication:  (Spanish, son interprets)  Cognition Arousal/Alertness: Awake/alert Behavior During Therapy: WFL for tasks assessed/performed Overall Cognitive Status: Within Functional Limits for tasks assessed                      General Comments      Exercises     Assessment/Plan    PT Assessment Patient needs continued PT services  PT Problem List Decreased activity tolerance;Decreased mobility;Decreased knowledge of precautions;Pain          PT Treatment Interventions DME instruction;Gait training;Stair training;Functional mobility training;Therapeutic activities;Patient/family education    PT Goals (Current goals can be found in the Care Plan section)  Acute Rehab PT Goals Patient Stated  Goal: none stated PT Goal Formulation: With patient/family Time For Goal Achievement: 07/26/16 Potential to Achieve Goals: Good    Frequency Min 5X/week   Barriers to discharge        Co-evaluation PT/OT/SLP Co-Evaluation/Treatment: Yes Reason for Co-Treatment: For patient/therapist safety PT goals addressed during session: Mobility/safety with mobility OT goals addressed during session: ADL's and self-care       End of Session   Activity Tolerance: Patient  tolerated treatment well Patient left: in chair;with call bell/phone within reach;with family/visitor present Nurse Communication: Mobility status    Functional Assessment Tool Used: clinical judgement Functional Limitation: Mobility: Walking and moving around Mobility: Walking and Moving Around Current Status ([id number](G8978): At least 20 percent but less than 40 percent impaired, limited or restricted Mobility: Walking and Moving Around Goal Status [phone]([id number]): At least 1 percent but less than 20 percent impaired, limited or restricted    Time: 0730-0805 PT Time Calculation (min) (ACUTE ONLY): 35 min   Charges:   PT Evaluation $PT Eval Low Complexity: 1 Procedure     PT G Codes:   PT G-Codes **NOT FOR INPATIENT CLASS** Functional Assessment Tool Used: clinical judgement Functional Limitation: Mobility: Walking and moving around Mobility: Walking and Moving Around Current Status ([id number](G8978): At least 20 percent but less than 40 percent impaired, limited or restricted Mobility: Walking and Moving Around Goal Status [phone](G8979): At least 1 percent but less than 20 percent impaired, limited or restricted    Rada HayHill, Marguerite Barba Elizabeth 07/24/2016, 11:42 AM Blanchard KelchKaren Michial Disney PT [phone]

## 2016-07-24 NOTE — Care Management Note (Signed)
Case Management Note  Patient Details  Name: Christian Wade MRN: [id number] Date of Birth: 15-Feb-1966  Subjective/Objective:                   s/p L2-3 decompression    Action/Plan: Discharge planning Expected Discharge Date:                  Expected Discharge Plan:  Home/Self Care  In-House Referral:     Discharge planning Services  CM Consult  Post Acute Care Choice:  Durable Medical Equipment Choice offered to:  Patient, Adult Children  DME Arranged:  3-N-1, Walker rolling DME Agency:  [place]:  NA [place] Agency:  NA  Status of Service:  Completed, signed off  If discussed at [place] of Stay Meetings, dates discussed:    Additional Comments: CM met with pt who speaks enough English to decline interpretive services and opt for son (in room) to translate. Pt has no contact for Workers Comp so CM called contact on [place] but have not received callback.  Family requests prescription for DME and are paying out of pocket and state their attorney will get reimbursement.  No HH services were recc or ordered.  CM notified [facility] [employer] rep, Larene Beach to please deliver rolling walker and 3n1 to room and pt has credit card for payment.  NO other CM needs were communicated. Dellie Catholic, RN 07/24/2016, 2:25 PM

## 2016-07-29 NOTE — Op Note (Signed)
NAMNell Range:  Christian Wade, Christian Wade          ACCOUNT NO.:  [id number]  MEDICAL RECORD NO.:  [id number]  LOCATION:  1607                         [facility]  PHYSICIAN:  Christian Wade, M.D.    DATE OF BIRTH:  [birth year]  DATE OF PROCEDURE:  07/23/2016 DATE OF DISCHARGE:                              OPERATIVE REPORT   POSTOPERATIVE DIAGNOSIS:  Spinal stenosis, herniated nucleus pulposus at L2-3.  POSTOPERATIVE DIAGNOSIS:  Spinal stenosis, herniated nucleus pulposus at L2-3.  PROCEDURE PERFORMED:  Microlumbar decompression at L2-3 with bilateral hemilaminotomies of L2-3 and bilateral foraminotomies, L2-L3, microdiskectomy L2-3.  ANESTHESIA:  General.  ASSISTANT:  Christian GrimeJaclyn Bissell, PA.  HISTORY:  This is a 50 year old with lower extremity radicular pain.  He had been refractory to conservative treatment.  He had a disk herniation at L2-3.  Had persistent symptoms.  He had lower extremity radicular pain in the L3 nerve root distribution.  He was indicated for a decompression failing conservative treatment.  Risks and benefits discussed including bleeding, infection, damage to neurovascular structures, DVT, PE, anesthetic complications, no change in symptoms, worsening symptoms, etc.  The patient had more symptoms on the left than right.  It had migrated caudal to the disk space.  He had failed conservative treatment, and he was indicated for decompression.  The risk and benefits discussed including bleeding, infection, damage to neurovascular structures, DVT, PE, anesthetic complications, etc. Again, he had left-sided symptoms migrating caudad.  TECHNIQUE:  The patient supine after the induction of adequate general anesthesia, 2 g Kefzol, placed prone on the [place] frame.  All bony prominences were well padded.  Lumbar region was prepped and draped in usual sterile fashion.  Two 18-gauge spinal needle was utilized to localize L4-5 interspace, confirmed with x-ray.  Incision was made  from spinous process 2-3.  Subcutaneous tissue was dissected.  Electrocautery was utilized to achieve hemostasis.  Dorsolumbar fascia identified, divided on skin incision.  Paraspinous muscle was elevated from lamina in 2 and 3.  McCullough retractors placed, a Penfield 4 in the interlaminar space.  This was confirmed with x-ray.  Next the operating microscope was draped and brought on the surgical field.  We used a small interlaminar window at 2-3 bilaterally.  We therefore removed the portion of the spinous process of 3 and then proceeded with bilateral hemilaminotomies.  Detached the ligamentum flavum from the cephalad edge of 3 utilizing a straight micro curette.  I then performed hemilaminotomy of the caudad edge of 2 bilaterally.  I removed the interspinous ligament.  I then again performed hemilaminotomies of 3 protecting the neural elements at all times with foraminotomies of 3 bilaterally.  In the left lateral recess my caudad to the disk space. There was a disk herniation noted subligamentous paracentral to the left and ligamentum flavum hypertrophy.  I performed a diskectomy.  A small extruded fragment was excised.  Retracted the disk space.  There was no residual disk herniation into the disk space.  After performing foraminotomies bilaterally, I placed a Penfield in the disk space. Confirmed it by x-ray.  No probe passed freely at the foramen at 2 and 3 bilaterally.  We decompressed the lateral recess of the medial border of the pedicle.  There was stenosis noted bilaterally.  Bipolar electrocautery was utilized to achieve hemostasis.  Placed thrombin- soaked Gelfoam in laminotomy defects.  No evidence of CSF leakage or active bleeding.  Next, we removed the [facility]. Paraspinous muscles inspected.  No evidence of active bleeding.  We closed the dorsolumbar fascia with 1 Vicryl, subcu with 2-0, and skin with subcuticular Prolene.  Sterile dressing applied.  Placed  supine on the hospital bed, extubated without difficulty, and transported to the recovery room in satisfactory condition.  The patient tolerated the procedure well.  No complications.  Assistant, Christian Wade, [place].  Minimal blood loss.     Christian Wade, M.D.     Cordelia PenJB/MEDQ  D:  07/28/2016  T:  07/29/2016  Job:  [id number]

## 2018-05-30 IMAGING — DX DG SPINE 1V PORT
1 series · 1 of 1 positions shown · non-contrast
Comparison: Film from earlier in the same day

CLINICAL DATA: Intraoperative lumbar localization

EXAM:
PORTABLE SPINE - 1 VIEW

[l-spine lat]
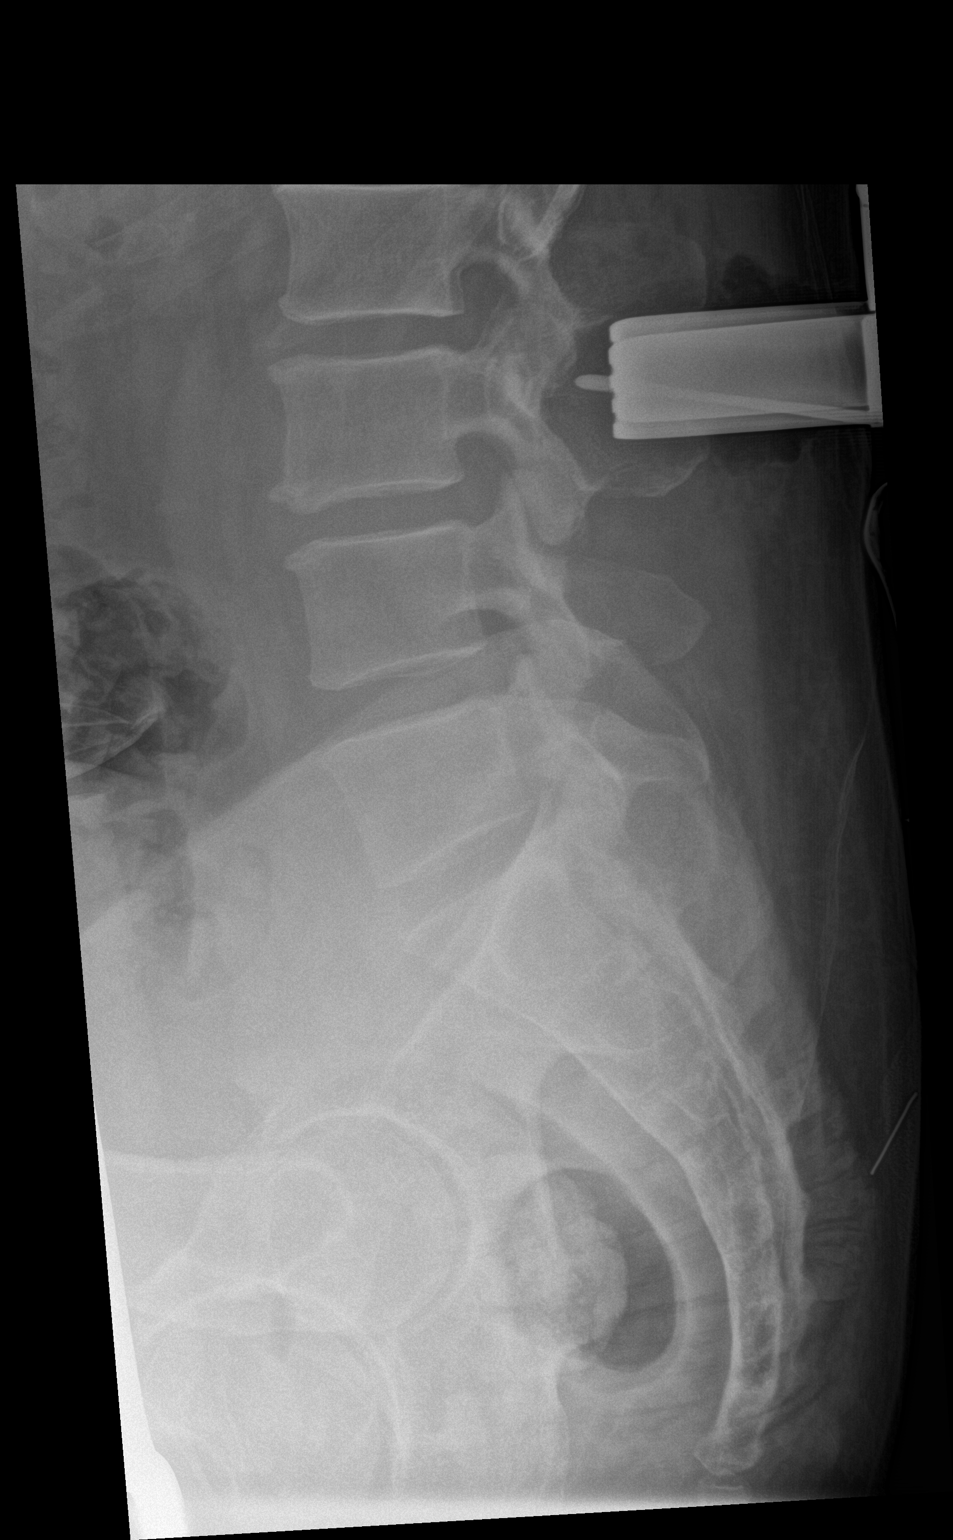

[1 of 1 positions shown; findings below may reference images not displayed]

FINDINGS: A surgical instrument and retractor are now seen posterior to the
L2-3 interspace. The numbering nomenclature is similar to that
utilized on the prior exam.
IMPRESSION: Surgical instrument at the L2-3 interspace.
